# Patient Record
Sex: Female | Born: 1960 | Race: White | Hispanic: No | Marital: Married | Smoking: Never smoker
Health system: Southern US, Community
[De-identification: ages and names within clinical notes are randomized; demographics above are authoritative.]

## PROBLEM LIST (undated history)

## (undated) DIAGNOSIS — I10 Essential (primary) hypertension: Secondary | ICD-10-CM

## (undated) DIAGNOSIS — R569 Unspecified convulsions: Secondary | ICD-10-CM

## (undated) HISTORY — PX: NASAL/SINUS ENDOSCOPY: SHX288

---

## 2018-10-19 ENCOUNTER — Emergency Department (HOSPITAL_COMMUNITY)
Admission: EM | Admit: 2018-10-19 | Discharge: 2018-10-19 | Disposition: A | Discharge status: Rehabilitation Facility | Payer: BLUE CROSS/BLUE SHIELD | Attending: Emergency Medicine | Admitting: Emergency Medicine

## 2018-10-19 ENCOUNTER — Other Ambulatory Visit: Payer: Self-pay

## 2018-10-19 DIAGNOSIS — R55 Syncope and collapse: Secondary | ICD-10-CM | POA: Diagnosis present

## 2018-10-19 DIAGNOSIS — F10930 Alcohol use, unspecified with withdrawal, uncomplicated: Secondary | ICD-10-CM

## 2018-10-19 DIAGNOSIS — F10230 Alcohol dependence with withdrawal, uncomplicated: Secondary | ICD-10-CM

## 2018-10-19 DIAGNOSIS — R531 Weakness: Secondary | ICD-10-CM | POA: Insufficient documentation

## 2018-10-19 LAB — CBG MONITORING, ED: Glucose-Capillary: 99 mg/dL (ref 70–99)

## 2018-10-19 LAB — BASIC METABOLIC PANEL
Anion gap: 12 (ref 5–15)
BUN: 9 mg/dL (ref 6–20)
CO2: 21 mmol/L — ABNORMAL LOW (ref 22–32)
Calcium: 7.6 mg/dL — ABNORMAL LOW (ref 8.9–10.3)
Chloride: 101 mmol/L (ref 98–111)
Creatinine, Ser: 0.61 mg/dL (ref 0.44–1.00)
GFR calc Af Amer: >60 mL/min (ref >60)
GFR calc non Af Amer: >60 mL/min (ref >60)
Glucose, Bld: 117 mg/dL — ABNORMAL HIGH (ref 70–99)
Potassium: 4.3 mmol/L (ref 3.5–5.1)
Sodium: 134 mmol/L — ABNORMAL LOW (ref 135–145)

## 2018-10-19 LAB — ETHANOL: Alcohol, Ethyl (B): <10 mg/dL (ref <10)

## 2018-10-19 LAB — CBC
HCT: 31.4 % — ABNORMAL LOW (ref 36.0–46.0)
Hemoglobin: 9.8 g/dL — ABNORMAL LOW (ref 12.0–15.0)
MCH: 38.3 pg — ABNORMAL HIGH (ref 26.0–34.0)
MCHC: 31.2 g/dL (ref 30.0–36.0)
MCV: 122.7 fL — ABNORMAL HIGH (ref 80.0–100.0)
Platelets: 53 10*3/uL — ABNORMAL LOW (ref 150–400)
RBC: 2.56 MIL/uL — ABNORMAL LOW (ref 3.87–5.11)
RDW: 14.7 % (ref 11.5–15.5)
WBC: 2.6 10*3/uL — ABNORMAL LOW (ref 4.0–10.5)
nRBC: 0 % (ref 0.0–0.2)

## 2018-10-19 LAB — URINALYSIS, ROUTINE W REFLEX MICROSCOPIC
Bacteria, UA: NONE SEEN
Glucose, UA: NEGATIVE mg/dL
Hgb urine dipstick: NEGATIVE
Ketones, ur: NEGATIVE mg/dL
Nitrite: NEGATIVE
Protein, ur: 30 mg/dL — AB
Specific Gravity, Urine: 1.014 (ref 1.005–1.030)
pH: 7 (ref 5.0–8.0)

## 2018-10-19 LAB — I-STAT BETA HCG BLOOD, ED (MC, WL, AP ONLY): I-stat hCG, quantitative: 13.4 m[IU]/mL — ABNORMAL HIGH (ref <5)

## 2018-10-19 LAB — HCG, QUANTITATIVE, PREGNANCY: hCG, Beta Chain, Quant, S: 6 m[IU]/mL — ABNORMAL HIGH (ref <5)

## 2018-10-19 MED ORDER — SODIUM CHLORIDE 0.9% FLUSH: 3.0000 mL | Freq: Once | INTRAVENOUS | Status: DC

## 2018-10-19 MED ORDER — SODIUM CHLORIDE 0.9 % IV BOLUS
1000.0000 mL | Freq: Once | INTRAVENOUS | Status: AC
  Administered 2018-10-19: 18:00:00 1000 mL via INTRAVENOUS

## 2018-10-19 MED ORDER — LORAZEPAM 1 MG PO TABS
2.0000 mg | ORAL_TABLET | Freq: Once | ORAL | Status: AC
  Administered 2018-10-19: 2 mg via ORAL
  Filled 2018-10-19: qty 2

## 2018-10-19 NOTE — ED Triage Notes (Addendum)
Per EMS, patient from [facility] where she is being treated for alcohol detox, had syncopal episode in her wheelchair. Initially hypotensive. Given IM Ativan at 1230. Denies complaints.   20g L FA 546ml NS  130/96 CBG 168

## 2018-10-19 NOTE — ED Notes (Signed)
Called lab to add on hcg quantitative test

## 2018-10-19 NOTE — Discharge Instructions (Addendum)
You were evaluated in the Emergency Department and after careful evaluation, we did not find any emergent condition requiring admission or further testing in the hospital. ° °Please return to the Emergency Department if you experience any worsening of your condition.  We encourage you to follow up with a primary care provider.  Thank you for allowing us to be a part of your care. °

## 2018-10-19 NOTE — ED Provider Notes (Signed)
[facility] Emergency Department Provider Note MRN:  [id number]  Arrival date & time: 10/19/18     Chief Complaint   Loss of Consciousness   History of Present Illness   Miranda Jacobs is a 58 y.o. year-old female with a history of alcohol abuse presenting to the ED with chief complaint of syncope.  Patient passed out yesterday.  Thinks it might be related to alcohol and dehydration.  Drinks every day.  Today feeling generally weak, general malaise.  Denies headache or vision change, no trauma, no chest pain or shortness of breath, no abdominal pain, no dysuria.  Symptoms constant, mild, no exacerbating relieving factors.  Review of Systems  A complete 10 system review of systems was obtained and all systems are negative except as noted in the HPI and PMH.   Patient's Health History   Past medical history: Alcohol abuse Social history: Drinks daily No family history on file.  Social History   Socioeconomic History  . Marital status: Married    Spouse name: Not on file  . Number of children: Not on file  . Years of education: Not on file  . Highest education level: Not on file  Occupational History  . Not on file  Social Needs  . Financial resource strain: Not on file  . Food insecurity    Worry: Not on file    Inability: Not on file  . Transportation needs    Medical: Not on file    Non-medical: Not on file  Tobacco Use  . Smoking status: Not on file  Substance and Sexual Activity  . Alcohol use: Not on file  . Drug use: Not on file  . Sexual activity: Not on file  Lifestyle  . Physical activity    Days per week: Not on file    Minutes per session: Not on file  . Stress: Not on file  Relationships  . Social Herbalist on phone: Not on file    Gets together: Not on file    Attends religious service: Not on file    Active member of club or organization: Not on file    Attends meetings of clubs or organizations: Not on file   Relationship status: Not on file  . Intimate partner violence    Fear of current or ex partner: Not on file    Emotionally abused: Not on file    Physically abused: Not on file    Forced sexual activity: Not on file  Other Topics Concern  . Not on file  Social History Narrative  . Not on file     Physical Exam  Vital Signs and Nursing Notes reviewed Vitals:   10/19/18 1804 10/19/18 1826  BP: (!) 136/98 (!) 136/98  Pulse: 68 72  Resp:  (!) 26  Temp:    SpO2:  98%    CONSTITUTIONAL: Chronically ill-appearing, NAD NEURO:  Alert and oriented x 3, no focal deficits EYES:  eyes equal and reactive ENT/NECK:  no LAD, no JVD CARDIO: Regular rate, well-perfused, normal S1 and S2 PULM:  CTAB no wheezing or rhonchi GI/GU:  normal bowel sounds, non-distended, non-tender MSK/SPINE:  No gross deformities, no edema SKIN:  no rash, atraumatic PSYCH:  Appropriate speech and behavior  Diagnostic and Interventional Summary    EKG Interpretation  Date/Time:  Thursday October 19 2018 14:27:24 EDT Ventricular Rate:  90 PR Interval:    QRS Duration: 85 QT Interval:  391 QTC Calculation: 479 R  Axis:   12 Text Interpretation:  Sinus rhythm Low voltage, precordial leads Baseline wander in lead(s) V4 no prior Confirmed by Kennis CarinaBero, Rodert Hinch [phone]([id number]) on 10/19/2018 5:21:25 PM      Labs Reviewed  BASIC METABOLIC PANEL - Abnormal; Notable for the following components:      Result Value   Sodium 134 (*)    CO2 21 (*)    Glucose, Bld 117 (*)    Calcium 7.6 (*)    All other components within normal limits  CBC - Abnormal; Notable for the following components:   WBC 2.6 (*)    RBC 2.56 (*)    Hemoglobin 9.8 (*)    HCT 31.4 (*)    MCV 122.7 (*)    MCH 38.3 (*)    Platelets 53 (*)    All other components within normal limits  URINALYSIS, ROUTINE W REFLEX MICROSCOPIC - Abnormal; Notable for the following components:   Color, Urine AMBER (*)    APPearance HAZY (*)    Bilirubin Urine SMALL (*)     Protein, ur 30 (*)    Leukocytes,Ua TRACE (*)    All other components within normal limits  HCG, QUANTITATIVE, PREGNANCY - Abnormal; Notable for the following components:   hCG, Beta Chain, Quant, S 6 (*)    All other components within normal limits  I-STAT BETA HCG BLOOD, ED ([facility], [facility], [facility] ONLY) - Abnormal; Notable for the following components:   I-stat hCG, quantitative 13.4 (*)    All other components within normal limits  ETHANOL  CBG MONITORING, ED    No orders to display    Medications  sodium chloride flush (NS) 0.9 % injection 3 mL (has no administration in time range)  sodium chloride 0.9 % bolus 1,000 mL (0 mLs Intravenous Stopped 10/19/18 1949)  LORazepam (ATIVAN) tablet 2 mg (2 mg Oral Given 10/19/18 2021)     Procedures Critical Care  ED Course and Medical Decision Making  I have reviewed the triage vital signs and the nursing notes.  Pertinent labs & imaging results that were available during my care of the patient were reviewed by me and considered in my medical decision making (see below for details).  EKG without acute concerns, favoring dehydration and alcohol intoxication as the etiology of patient's syncope.  Patient has normal neurological exam, normal vital signs here in the emergency department, benign abdomen, no evidence of trauma, will provide fluid bolus given the soft blood pressure, reassess.  Work-up is reassuring.  5:30 PM update: Upon reassessment patient is tremulous, withdrawing.  Admission offered, but patient prefers discharge.  Patient is already being cared for at a detox center where they can manage this withdrawal.  Will provide p.o. Ativan and reassess.  Patient is much better upon reassessment, appropriate for discharge back to her detox center.  Elmer SowMichael M. Pilar PlateBero, MD [facility] [email or web address]  Final Clinical Impressions(s) / ED Diagnoses     ICD-10-CM   1. Syncope, unspecified syncope  type  R55   2. Weakness  R53.1   3. Alcohol withdrawal syndrome without complication (HCC)  F10.230     ED Discharge Orders    None      Discharge Instructions Discussed with and Provided to Patient:   Discharge Instructions     You were evaluated in the Emergency Department and after careful evaluation, we did not find any emergent condition requiring admission or further testing in the hospital.  Please return to the  Emergency Department if you experience any worsening of your condition.  We encourage you to follow up with a primary care provider.  Thank you for allowing [place] to be a part of your care.        Sabas Sous, MD 10/19/18 [phone]

## 2018-10-19 NOTE — ED Notes (Signed)
[facility] contacted to arrange transportation back to facility.

## 2018-10-19 NOTE — ED Notes (Signed)
Patient resting with eyes closed in triage room.

## 2018-10-21 ENCOUNTER — Emergency Department (HOSPITAL_COMMUNITY): Payer: BLUE CROSS/BLUE SHIELD

## 2018-10-21 ENCOUNTER — Emergency Department (HOSPITAL_COMMUNITY)
Admission: EM | Admit: 2018-10-21 | Discharge: 2018-10-22 | Disposition: A | Discharge status: Home / Self Care | Payer: BLUE CROSS/BLUE SHIELD | Attending: Emergency Medicine | Admitting: Emergency Medicine

## 2018-10-21 ENCOUNTER — Other Ambulatory Visit: Payer: Self-pay

## 2018-10-21 ENCOUNTER — Encounter (HOSPITAL_COMMUNITY): Payer: Self-pay | Admitting: Emergency Medicine

## 2018-10-21 DIAGNOSIS — R410 Disorientation, unspecified: Secondary | ICD-10-CM | POA: Diagnosis present

## 2018-10-21 DIAGNOSIS — R262 Difficulty in walking, not elsewhere classified: Secondary | ICD-10-CM | POA: Diagnosis not present

## 2018-10-21 DIAGNOSIS — Z79899 Other long term (current) drug therapy: Secondary | ICD-10-CM | POA: Insufficient documentation

## 2018-10-21 DIAGNOSIS — I10 Essential (primary) hypertension: Secondary | ICD-10-CM | POA: Insufficient documentation

## 2018-10-21 HISTORY — DX: Essential (primary) hypertension: I10

## 2018-10-21 HISTORY — DX: Unspecified convulsions: R56.9

## 2018-10-21 LAB — URINALYSIS, ROUTINE W REFLEX MICROSCOPIC
Bilirubin Urine: NEGATIVE
Glucose, UA: NEGATIVE mg/dL
Hgb urine dipstick: NEGATIVE
Ketones, ur: 20 mg/dL — AB
Leukocytes,Ua: NEGATIVE
Nitrite: NEGATIVE
Protein, ur: NEGATIVE mg/dL
Specific Gravity, Urine: 1.011 (ref 1.005–1.030)
pH: 7 (ref 5.0–8.0)

## 2018-10-21 LAB — CBC WITH DIFFERENTIAL/PLATELET
Abs Immature Granulocytes: 0.02 10*3/uL (ref 0.00–0.07)
Basophils Absolute: 0 10*3/uL (ref 0.0–0.1)
Basophils Relative: 1 %
Eosinophils Absolute: 0.1 10*3/uL (ref 0.0–0.5)
Eosinophils Relative: 3 %
HCT: 28.1 % — ABNORMAL LOW (ref 36.0–46.0)
Hemoglobin: 9.5 g/dL — ABNORMAL LOW (ref 12.0–15.0)
Immature Granulocytes: 1 %
Lymphocytes Relative: 20 %
Lymphs Abs: 0.7 10*3/uL (ref 0.7–4.0)
MCH: 38.6 pg — ABNORMAL HIGH (ref 26.0–34.0)
MCHC: 33.8 g/dL (ref 30.0–36.0)
MCV: 114.2 fL — ABNORMAL HIGH (ref 80.0–100.0)
Monocytes Absolute: 0.5 10*3/uL (ref 0.1–1.0)
Monocytes Relative: 13 %
Neutro Abs: 2.3 10*3/uL (ref 1.7–7.7)
Neutrophils Relative %: 62 %
Platelets: 146 10*3/uL — ABNORMAL LOW (ref 150–400)
RBC: 2.46 MIL/uL — ABNORMAL LOW (ref 3.87–5.11)
RDW: 14.4 % (ref 11.5–15.5)
WBC: 3.7 10*3/uL — ABNORMAL LOW (ref 4.0–10.5)
nRBC: 0 % (ref 0.0–0.2)

## 2018-10-21 LAB — COMPREHENSIVE METABOLIC PANEL
ALT: 55 U/L — ABNORMAL HIGH (ref 0–44)
AST: 111 U/L — ABNORMAL HIGH (ref 15–41)
Albumin: 3 g/dL — ABNORMAL LOW (ref 3.5–5.0)
Alkaline Phosphatase: 79 U/L (ref 38–126)
Anion gap: 9 (ref 5–15)
BUN: 6 mg/dL (ref 6–20)
CO2: 24 mmol/L (ref 22–32)
Calcium: 8.2 mg/dL — ABNORMAL LOW (ref 8.9–10.3)
Chloride: 101 mmol/L (ref 98–111)
Creatinine, Ser: 0.51 mg/dL (ref 0.44–1.00)
GFR calc Af Amer: >60 mL/min (ref >60)
GFR calc non Af Amer: >60 mL/min (ref >60)
Glucose, Bld: 111 mg/dL — ABNORMAL HIGH (ref 70–99)
Potassium: 4.3 mmol/L (ref 3.5–5.1)
Sodium: 134 mmol/L — ABNORMAL LOW (ref 135–145)
Total Bilirubin: 2.3 mg/dL — ABNORMAL HIGH (ref 0.3–1.2)
Total Protein: 5.5 g/dL — ABNORMAL LOW (ref 6.5–8.1)

## 2018-10-21 LAB — RAPID URINE DRUG SCREEN, HOSP PERFORMED
Amphetamines: NOT DETECTED
Barbiturates: NOT DETECTED
Benzodiazepines: POSITIVE — AB
Cocaine: NOT DETECTED
Opiates: NOT DETECTED
Tetrahydrocannabinol: NOT DETECTED

## 2018-10-21 LAB — AMMONIA: Ammonia: 34 umol/L (ref 9–35)

## 2018-10-21 LAB — LIPASE, BLOOD: Lipase: 33 U/L (ref 11–51)

## 2018-10-21 LAB — ETHANOL: Alcohol, Ethyl (B): <10 mg/dL (ref <10)

## 2018-10-21 MED ORDER — LORAZEPAM 2 MG/ML IJ SOLN
1.0000 mg | Freq: Once | INTRAMUSCULAR | Status: AC
  Administered 2018-10-21: 21:00:00 1 mg via INTRAVENOUS
  Filled 2018-10-21: qty 1

## 2018-10-21 MED ORDER — THIAMINE HCL 100 MG/ML IJ SOLN
100.0000 mg | Freq: Once | INTRAMUSCULAR | Status: AC
  Administered 2018-10-21: 100 mg via INTRAVENOUS
  Filled 2018-10-21: qty 2

## 2018-10-21 MED ORDER — ADULT MULTIVITAMIN W/MINERALS CH
1.0000 | ORAL_TABLET | Freq: Once | ORAL | Status: AC
  Administered 2018-10-21: 1 via ORAL
  Filled 2018-10-21: qty 1

## 2018-10-21 MED ORDER — FOLIC ACID 1 MG PO TABS
1.0000 mg | ORAL_TABLET | Freq: Once | ORAL | Status: AC
  Administered 2018-10-21: 1 mg via ORAL
  Filled 2018-10-21: qty 1

## 2018-10-21 MED ORDER — SODIUM CHLORIDE 0.9 % IV BOLUS
1000.0000 mL | Freq: Once | INTRAVENOUS | Status: AC
  Administered 2018-10-21: 1000 mL via INTRAVENOUS

## 2018-10-21 NOTE — ED Provider Notes (Signed)
Pt sent from [facility] for evaluation because falling. Pt attributes this to ativan she has been taking for ETOH withdrawal. She tells me her last drink was yesterday although ED note from 10/19/18 stating:  "Patient is already being cared for at a detox center where they can manage this withdrawal."   Pt found on the ground in her room laying next to bed. She says she slipped. She was able to get up herself though back into bed unassisted. Her neuro exam is nonfocal. To me she simply seems drowsy. ED w/u including MR brain was unremarkable. She is not showing signs of severe etoh withdrawal. Unfortunately, [facility] will not accept her back. She received ativan for MRI and even more drowsy. Will have to hold in ED overnight until can be safely discharged in the morning.    Virgel Manifold, MD 10/21/18 2132

## 2018-10-21 NOTE — ED Notes (Signed)
Pt in MRI.

## 2018-10-21 NOTE — ED Triage Notes (Signed)
Patient arrived by EMS from [place] she's being treated for detox. Pt was sent from [facility] due to [place] per facility.   Pt is Alert and Oriented x 4.   Patient isn't c/o any pain.

## 2018-10-21 NOTE — ED Notes (Signed)
EKG handed to [facility] MD.

## 2018-10-21 NOTE — ED Notes (Addendum)
Miranda Jacobs from [facility] called and stated that they are not taking this pt back because her acuity level is too high and too much for them to handle per their supervisor. Notified Ngozi, Therapist, sports

## 2018-10-21 NOTE — ED Provider Notes (Signed)
[facility]-EMERGENCY DEPT Provider Note   CSN: [id number] Arrival date & time: 10/21/18  1352     History   Chief Complaint No chief complaint on file.   HPI Miranda Jacobs is a 58 y.o. female.     HPI   58 year old female, with a PMH of alcohol abuse, presents due to abnormal behavior.  Patient is staying at [facility].  She notes that her last drink was yesterday morning.  She states that she started withdrawing from alcohol yesterday evening.  She has been taking Ativan for this.  She says that this morning that she had an unsteady gait due to her alcohol withdrawals.  This concerned the facility and they sent her here for further evaluation.  Patient alert and oriented x4.  She denies any alcohol within the last 24 hours.  Patient denies any vision loss, weakness, numbness, tingling.  She denies any chest pain, shortness of breath, nausea, vomiting, abdominal pain.   Past Medical History:  Diagnosis Date   Hypertension    Seizures (HCC)     There are no active problems to display for this patient.   Past Surgical History:  Procedure Laterality Date   NASAL/SINUS ENDOSCOPY       OB History   No obstetric history on file.      Home Medications    Prior to Admission medications   Medication Sig Start Date End Date Taking? Authorizing Provider  cloNIDine (CATAPRES) 0.1 MG tablet Take 0.1 mg by mouth 2 (two) times daily. 10/08/18   [provider]  escitalopram (LEXAPRO) 10 MG tablet Take 10 mg by mouth daily. 10/08/18   [provider]  lamoTRIgine (LAMICTAL) 100 MG tablet Take 100 mg by mouth 2 (two) times daily. 08/14/18   [provider]  lisinopril (ZESTRIL) 10 MG tablet Take 10 mg by mouth daily. 10/08/18   [provider]  Multiple Vitamin (MULTIVITAMIN) capsule Take 1 capsule by mouth daily.    [provider]    Family History History reviewed. No pertinent family history.  Social  History Social History   Tobacco Use   Smoking status: Never Smoker   Smokeless tobacco: Never Used  Substance Use Topics   Alcohol use: Yes    Alcohol/week: 25.0 standard drinks    Types: 25 Glasses of wine per week    Comment: Patient drinks three bottles of wine a day    Drug use: Not Currently     Allergies   Patient has no known allergies.   Review of Systems Review of Systems  Constitutional: Negative for chills and fever.  HENT: Negative for rhinorrhea and sore throat.   Eyes: Negative for visual disturbance.  Respiratory: Negative for cough and shortness of breath.   Cardiovascular: Negative for chest pain and leg swelling.  Gastrointestinal: Negative for abdominal pain, diarrhea, nausea and vomiting.  Genitourinary: Negative for dysuria, frequency and urgency.  Musculoskeletal: Negative for joint swelling and neck pain.  Skin: Negative for rash and wound.  Neurological: Negative for syncope, light-headedness, numbness and headaches.  All other systems reviewed and are negative.    Physical Exam Updated Vital Signs BP 126/89    Pulse 72    Temp 98.4 F (36.9 C) (Oral)    Resp 14    Ht 5\' 3"  (1.6 m)    Wt 51.3 kg    SpO2 100%    BMI 20.02 kg/m   Physical Exam Vitals signs and nursing note reviewed.  Constitutional:  Appearance: She is well-developed.  HENT:     Head: Normocephalic and atraumatic.  Eyes:     Conjunctiva/sclera: Conjunctivae normal.  Neck:     Musculoskeletal: Neck supple.  Cardiovascular:     Rate and Rhythm: Normal rate and regular rhythm.     Heart sounds: Normal heart sounds. No murmur.  Pulmonary:     Effort: Pulmonary effort is normal. No respiratory distress.     Breath sounds: Normal breath sounds. No wheezing or rales.  Abdominal:     General: Bowel sounds are normal. There is no distension.     Palpations: Abdomen is soft.     Tenderness: There is no abdominal tenderness.  Musculoskeletal: Normal range of motion.          General: No tenderness or deformity.  Skin:    General: Skin is warm and dry.     Findings: No erythema or rash.  Neurological:     Mental Status: She is alert and oriented to person, place, and time.     Cranial Nerves: Cranial nerves are intact.     Sensory: Sensation is intact.     Motor: Tremor present.     Coordination: Finger-Nose-Finger Test abnormal (tremor BL).  Psychiatric:        Behavior: Behavior normal.      ED Treatments / Results  Labs (all labs ordered are listed, but only abnormal results are displayed) Labs Reviewed  CBC WITH DIFFERENTIAL/PLATELET  COMPREHENSIVE METABOLIC PANEL  LIPASE, BLOOD  URINALYSIS, ROUTINE W REFLEX MICROSCOPIC  ETHANOL  RAPID URINE DRUG SCREEN, HOSP PERFORMED    EKG None  Radiology No results found.  Procedures Procedures (including critical care time)  Medications Ordered in ED Medications - No data to display   Initial Impression / Assessment and Plan / ED Course  I have reviewed the triage vital signs and the nursing notes.  Pertinent labs & imaging results that were available during my care of the patient were reviewed by me and considered in my medical decision making (see chart for details).         1550: Nursing came to get me to tell me about the patient being found on the ground.  She was laying in the fetal position next of the door.  The tech asked the patient to stand up and patient stood and walked to the bed without difficulty.  Patient states that she slipped because of the socks causing her to fall.  She denies any injury.  However on reevaluation she could not tell me what year it was or where she was.  Attending, Dr. Wilson Singer called at bedside.  He saw and evaluated the patient.  Patient continues to have a nonfocal, nonlateralizing neuro exam.  CT head added on along with an ammonia level.  We will continue to monitor the patient.  Per nursing her CIWA score was a 1.  1730: I reevaluated the patient.   I tried to ambulate her and she was unable.  Whenever she tries to stand she starts to fall.  She has tremors of her bilateral upper extremities.  Patient unable to give me a timeline of when these symptoms started.  MRI brain ordered.    Patient presented from [place].  Per EMS report she was altered.  However when I initially evaluated patient she was alert and oriented x4.  She did note some gait difficulty after she took Ativan this morning.  However she states that when she takes Ativan she  often has difficulty ambulating.  Was then called to the room due to patient being found on the ground.  Per tech she ambulated to the bed without difficulty.  However on my reevaluation I tried to ambulate the patient and she had significant difficulty ambulating.  Otherwise her neuro exam is nonfocal, nonlateralizing.  Her vital signs have been stable in the ED.  Her blood work is unremarkable.  CT head shows no acute intracranial abnormalities.  Her UA, UDS, chest x-ray and MRI are pending.  Signout given to attending, Dr. Juleen ChinaKohut will follow-up on results.  Disposition pending remaining test.   Final Clinical Impressions(s) / ED Diagnoses   Final diagnoses:  None    ED Discharge Orders    None       Rueben BashKendrick, Dvontae Ruan S, PA-C 10/21/18 1746    Raeford RazorKohut, Stephen, MD 10/22/18 1202

## 2018-10-21 NOTE — ED Notes (Addendum)
Patient was found on the ground by medical staff. Patient was able to get up from ground w/ steady balance. Patient has confusion at times. Patient answers all orientation questions. Patient doesn't c/o of any pain. MD Wilson Singer made aware and assessed patient.

## 2018-10-22 NOTE — ED Notes (Signed)
[facility] spoke nurse supervisor  Informed pt will be discharged from [facility]  At this time.    Called mark Gaw, spouse left VM for transport and information regarding pt discharge.

## 2018-10-22 NOTE — ED Notes (Signed)
Pt assisted to dress, wheeled to ED entrance to meet with husband and daughter. Discharge paperwork and pts belongings sent with pt and family.

## 2018-10-22 NOTE — ED Provider Notes (Signed)
5:45 AM Clinically sober.  Alert and oriented.  NAD.  VSS.  Will discharge with outpatient resources.  [facility] won't take patient back.   Montine Circle, PA-C 10/22/18 5498    Virgel Manifold, MD 10/22/18 [phone]

## 2020-03-28 IMAGING — MR MR HEAD W/O CM
8 series · 48 of 48 positions shown · non-contrast
Comparison: CT head earlier today.

CLINICAL DATA: Altered mental status.  Detox.  Status post falls.

EXAM:
MRI HEAD WITHOUT CONTRAST
TECHNIQUE: Multiplanar, multiecho pulse sequences of the brain and surrounding
structures were obtained without intravenous contrast.

[Series 3: T1 · sagittal · 5.0mm · 0.47mm/px · 2 of 19 slices shown]
[im 1/19]
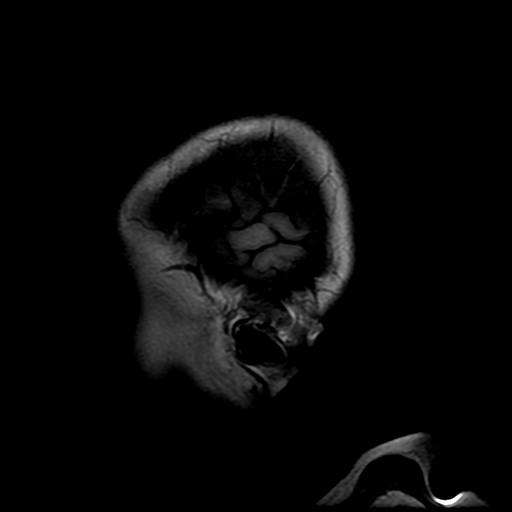
[im 19/19]
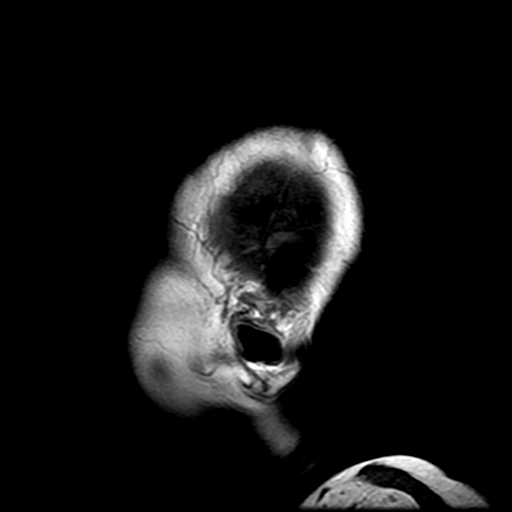

[Series 4: DWI · axial · 3.0mm · 1.09mm/px · z∈[-49,+91]mm · 11 of 96 slices shown (1 of 5)]
[im 1/96]
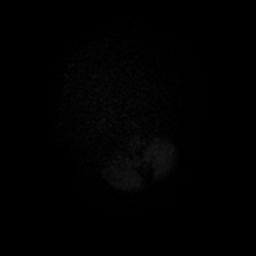
[im 10/96]
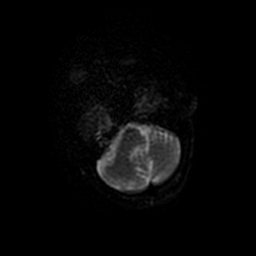
[im 20/96]
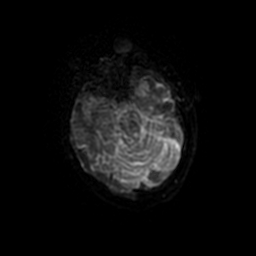
[im 29/96]
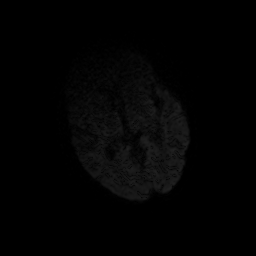
[im 39/96]
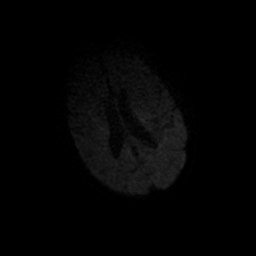
[im 48/96]
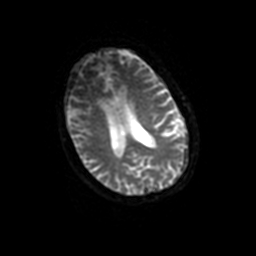
[im 58/96]
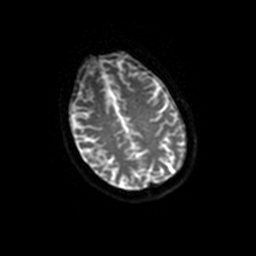
[im 67/96]
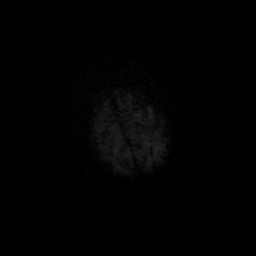
[im 77/96]
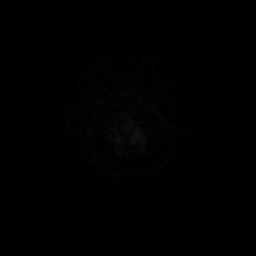
[im 86/96]
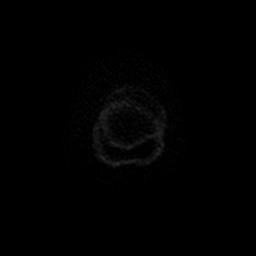
[im 96/96]
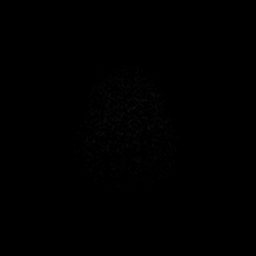

[Series 5: T2 · axial · 5.0mm · 0.86mm/px · z∈[-44,+99]mm · 3 of 25 slices shown]
[im 1/25]
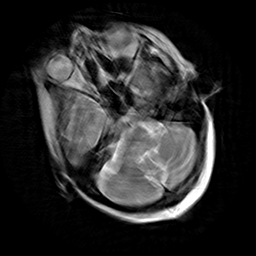
[im 13/25]
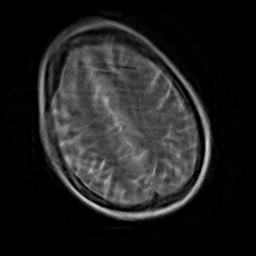
[im 25/25]
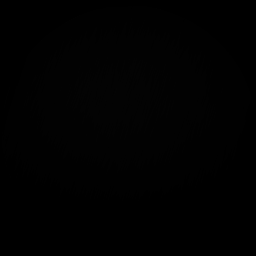

[Series 8: DWI · axial · 3.0mm · 1.09mm/px · z∈[-39,+99]mm · 11 of 96 slices shown (2 of 5)]
[im 1/96]
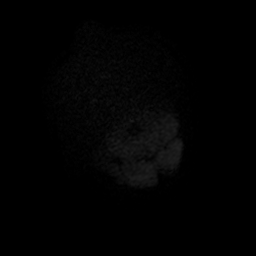
[im 10/96]
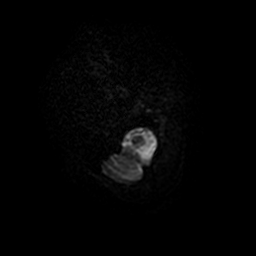
[im 20/96]
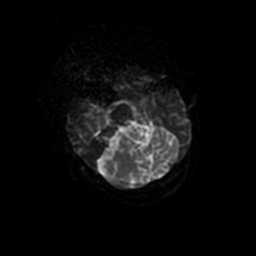
[im 29/96]
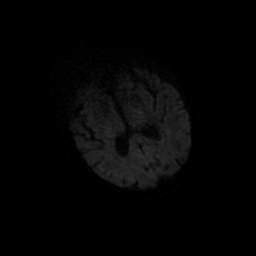
[im 39/96]
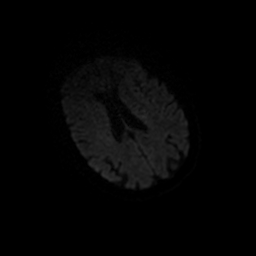
[im 48/96]
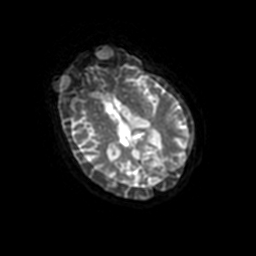
[im 58/96]
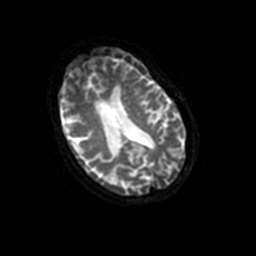
[im 67/96]
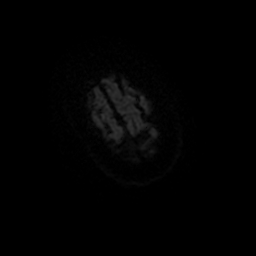
[im 77/96]
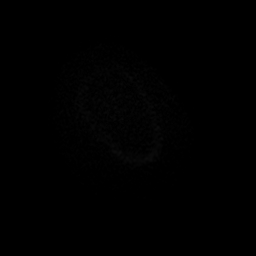
[im 86/96]
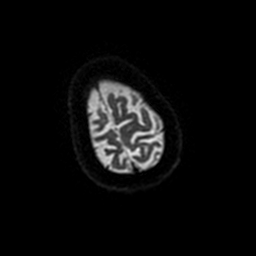
[im 96/96]
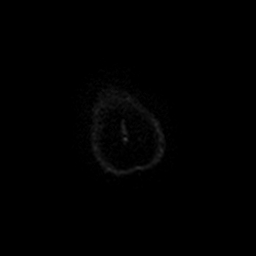

[Series 11: DWI · axial · 4.0mm · 1.17mm/px · z∈[-13,+131]mm · 8 of 66 slices shown (3 of 5)]
[im 1/66]
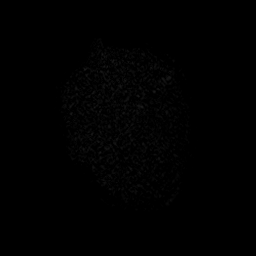
[im 10/66]
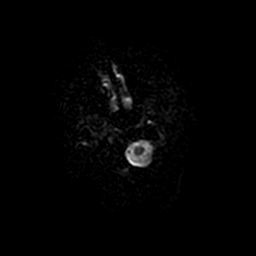
[im 19/66]
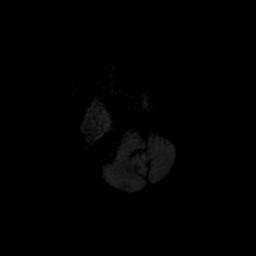
[im 28/66]
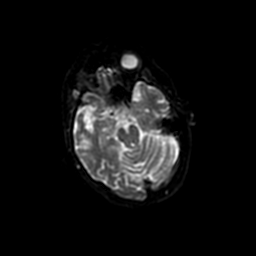
[im 38/66]
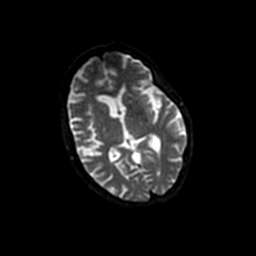
[im 47/66]
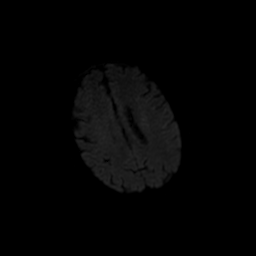
[im 56/66]
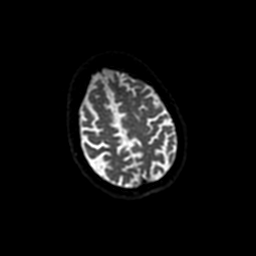
[im 66/66]
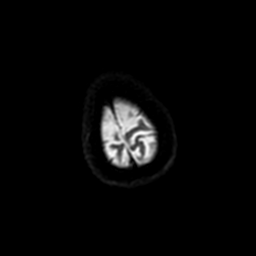

[Series 12: FLAIR · axial · 3.0mm · 0.86mm/px · z∈[-9,+135]mm · 3 of 25 slices shown]
[im 1/25]
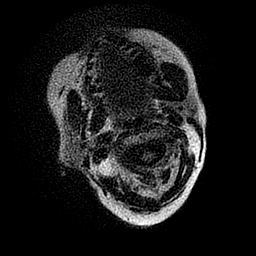
[im 13/25]
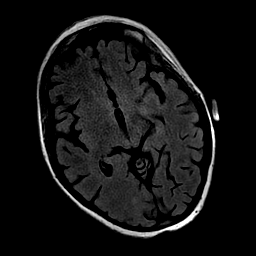
[im 25/25]
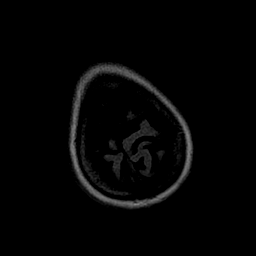

[Series 400: DWI · axial · 3.0mm · 1.09mm/px · z∈[-49,+91]mm · 6 of 48 slices shown (4 of 5)]
[im 1/48]
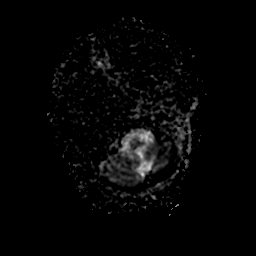
[im 10/48]
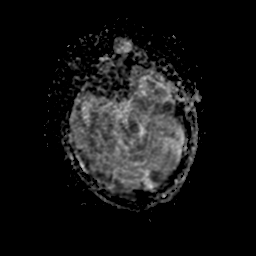
[im 19/48]
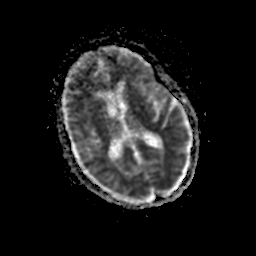
[im 29/48]
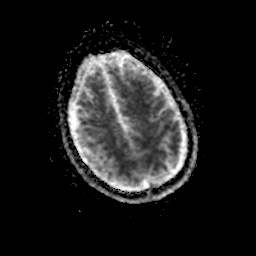
[im 38/48]
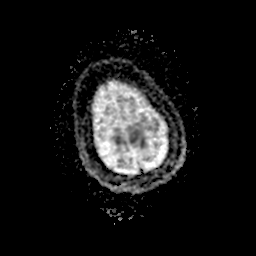
[im 48/48]
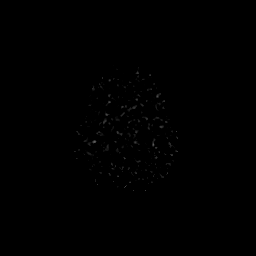

[Series 1100: DWI · axial · 4.0mm · 1.17mm/px · z∈[-13,+131]mm · 4 of 33 slices shown (5 of 5)]
[im 1/33]
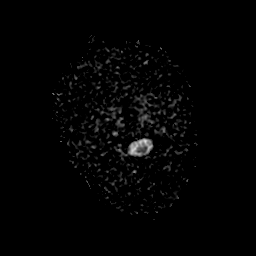
[im 11/33]
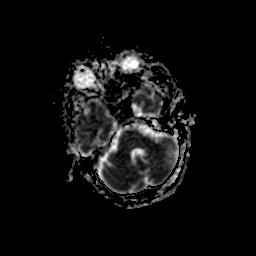
[im 22/33]
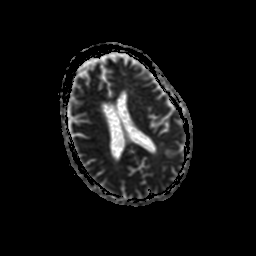
[im 33/33]
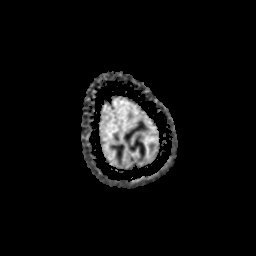

[48 of 48 positions shown; findings below may reference images not displayed]

FINDINGS: Significant motion degradation. The study is of marginal diagnostic
utility. Some sequences were unable to be completed despite patient
medication and best efforts of technologist.

Brain: No definite acute stroke, hemorrhage, mass lesion,
hydrocephalus, or extra-axial fluid. Premature for age atrophy. Mild
subcortical and periventricular T2 and FLAIR hyperintensities,
likely chronic microvascular ischemic change.

Vascular: Normal flow voids.

Skull and upper cervical spine: Normal marrow signal.

Sinuses/Orbits: No acute findings.  Postsurgical change.

Other: None.
IMPRESSION: Motion degraded examination demonstrating no acute stroke. Atrophy
and small vessel disease are observed.
